# Patient Record
Sex: Female | Born: 1990 | Race: Asian | Hispanic: No | Marital: Married
Health system: Southern US, Community
[De-identification: ages and names within clinical notes are randomized; demographics above are authoritative.]

---

## 2016-06-12 ENCOUNTER — Inpatient Hospital Stay (HOSPITAL_COMMUNITY): Admission: AD | Admit: 2016-06-12 | Payer: Self-pay | Source: Ambulatory Visit | Admitting: Obstetrics and Gynecology

## 2019-11-29 ENCOUNTER — Other Ambulatory Visit: Payer: Self-pay

## 2019-11-29 ENCOUNTER — Ambulatory Visit: Payer: HRSA Program | Attending: Internal Medicine

## 2019-11-29 DIAGNOSIS — Z20822 Contact with and (suspected) exposure to covid-19: Secondary | ICD-10-CM

## 2019-12-01 LAB — NOVEL CORONAVIRUS, NAA: SARS-CoV-2, NAA: NOT DETECTED

## 2019-12-14 ENCOUNTER — Ambulatory Visit: Payer: Self-pay | Attending: Internal Medicine

## 2019-12-14 DIAGNOSIS — Z20822 Contact with and (suspected) exposure to covid-19: Secondary | ICD-10-CM | POA: Insufficient documentation

## 2019-12-15 LAB — NOVEL CORONAVIRUS, NAA: SARS-CoV-2, NAA: NOT DETECTED

## 2019-12-28 ENCOUNTER — Ambulatory Visit: Payer: Self-pay | Attending: Internal Medicine

## 2019-12-28 DIAGNOSIS — Z20822 Contact with and (suspected) exposure to covid-19: Secondary | ICD-10-CM

## 2019-12-30 LAB — NOVEL CORONAVIRUS, NAA: SARS-CoV-2, NAA: NOT DETECTED

## 2020-01-14 ENCOUNTER — Ambulatory Visit: Payer: Self-pay | Attending: Internal Medicine

## 2020-01-14 DIAGNOSIS — Z20822 Contact with and (suspected) exposure to covid-19: Secondary | ICD-10-CM

## 2020-01-15 LAB — NOVEL CORONAVIRUS, NAA: SARS-CoV-2, NAA: NOT DETECTED

## 2020-01-26 ENCOUNTER — Emergency Department (HOSPITAL_COMMUNITY)
Admission: EM | Admit: 2020-01-26 | Discharge: 2020-01-27 | Disposition: A | Discharge status: Home / Self Care | Payer: Self-pay | Attending: Emergency Medicine | Admitting: Emergency Medicine

## 2020-01-26 ENCOUNTER — Encounter (HOSPITAL_COMMUNITY): Payer: Self-pay

## 2020-01-26 ENCOUNTER — Other Ambulatory Visit: Payer: Self-pay

## 2020-01-26 DIAGNOSIS — R309 Painful micturition, unspecified: Secondary | ICD-10-CM | POA: Insufficient documentation

## 2020-01-26 DIAGNOSIS — R350 Frequency of micturition: Secondary | ICD-10-CM | POA: Insufficient documentation

## 2020-01-26 DIAGNOSIS — N12 Tubulo-interstitial nephritis, not specified as acute or chronic: Secondary | ICD-10-CM | POA: Insufficient documentation

## 2020-01-26 DIAGNOSIS — R110 Nausea: Secondary | ICD-10-CM | POA: Insufficient documentation

## 2020-01-26 LAB — URINALYSIS, ROUTINE W REFLEX MICROSCOPIC
Bilirubin Urine: NEGATIVE
Glucose, UA: NEGATIVE mg/dL
Ketones, ur: 5 mg/dL — AB
Nitrite: NEGATIVE
Protein, ur: 30 mg/dL — AB
Specific Gravity, Urine: 1.012 (ref 1.005–1.030)
WBC, UA: >50 WBC/hpf — ABNORMAL HIGH (ref 0–5)
pH: 8 (ref 5.0–8.0)

## 2020-01-26 LAB — CBC
HCT: 41.9 % (ref 36.0–46.0)
Hemoglobin: 14.3 g/dL (ref 12.0–15.0)
MCH: 28.4 pg (ref 26.0–34.0)
MCHC: 34.1 g/dL (ref 30.0–36.0)
MCV: 83.3 fL (ref 80.0–100.0)
Platelets: 416 10*3/uL — ABNORMAL HIGH (ref 150–400)
RBC: 5.03 MIL/uL (ref 3.87–5.11)
RDW: 12 % (ref 11.5–15.5)
WBC: 10.6 10*3/uL — ABNORMAL HIGH (ref 4.0–10.5)
nRBC: 0 % (ref 0.0–0.2)

## 2020-01-26 LAB — COMPREHENSIVE METABOLIC PANEL
ALT: 57 U/L — ABNORMAL HIGH (ref 0–44)
AST: 49 U/L — ABNORMAL HIGH (ref 15–41)
Albumin: 4.3 g/dL (ref 3.5–5.0)
Alkaline Phosphatase: 79 U/L (ref 38–126)
Anion gap: 13 (ref 5–15)
BUN: 7 mg/dL (ref 6–20)
CO2: 23 mmol/L (ref 22–32)
Calcium: 9.5 mg/dL (ref 8.9–10.3)
Chloride: 100 mmol/L (ref 98–111)
Creatinine, Ser: 0.7 mg/dL (ref 0.44–1.00)
GFR calc Af Amer: >60 mL/min (ref >60)
GFR calc non Af Amer: >60 mL/min (ref >60)
Glucose, Bld: 108 mg/dL — ABNORMAL HIGH (ref 70–99)
Potassium: 3.7 mmol/L (ref 3.5–5.1)
Sodium: 136 mmol/L (ref 135–145)
Total Bilirubin: 0.9 mg/dL (ref 0.3–1.2)
Total Protein: 7.8 g/dL (ref 6.5–8.1)

## 2020-01-26 LAB — I-STAT BETA HCG BLOOD, ED (MC, WL, AP ONLY): I-stat hCG, quantitative: <5 m[IU]/mL (ref <5)

## 2020-01-26 LAB — LIPASE, BLOOD: Lipase: 25 U/L (ref 11–51)

## 2020-01-26 MED ORDER — SODIUM CHLORIDE 0.9% FLUSH
3.0000 mL | Freq: Once | INTRAVENOUS | Status: AC
  Administered 2020-01-27: 02:00:00 3 mL via INTRAVENOUS

## 2020-01-26 NOTE — ED Triage Notes (Signed)
Pt has been having L sided flank pain for the past 4 days as well as lower abd pain with dysuria and nausea.

## 2020-01-27 ENCOUNTER — Other Ambulatory Visit: Payer: Self-pay

## 2020-01-27 ENCOUNTER — Telehealth (HOSPITAL_COMMUNITY): Payer: Self-pay | Admitting: Emergency Medicine

## 2020-01-27 ENCOUNTER — Emergency Department (HOSPITAL_COMMUNITY): Payer: Self-pay

## 2020-01-27 MED ORDER — SODIUM CHLORIDE 0.9 % IV SOLN
2.0000 g | Freq: Once | INTRAVENOUS | Status: AC
  Administered 2020-01-27: 03:00:00 2 g via INTRAVENOUS
  Filled 2020-01-27: qty 20

## 2020-01-27 MED ORDER — ONDANSETRON HCL 4 MG PO TABS: 4.0000 mg | ORAL_TABLET | Freq: Four times a day (QID) | ORAL | 0 refills | Status: AC | PRN

## 2020-01-27 MED ORDER — HYDROCODONE-ACETAMINOPHEN 5-325 MG PO TABS: 1.0000 | ORAL_TABLET | ORAL | 0 refills | Status: DC | PRN

## 2020-01-27 MED ORDER — ONDANSETRON HCL 4 MG/2ML IJ SOLN
4.0000 mg | Freq: Once | INTRAMUSCULAR | Status: AC
  Administered 2020-01-27: 03:00:00 4 mg via INTRAVENOUS
  Filled 2020-01-27: qty 2

## 2020-01-27 MED ORDER — MORPHINE SULFATE (PF) 4 MG/ML IV SOLN
4.0000 mg | Freq: Once | INTRAVENOUS | Status: AC
  Administered 2020-01-27: 03:00:00 4 mg via INTRAVENOUS
  Filled 2020-01-27: qty 1

## 2020-01-27 MED ORDER — SODIUM CHLORIDE 0.9 % IV BOLUS
500.0000 mL | Freq: Once | INTRAVENOUS | Status: AC
  Administered 2020-01-27: 500 mL via INTRAVENOUS

## 2020-01-27 MED ORDER — CEPHALEXIN 500 MG PO CAPS: 500.0000 mg | ORAL_CAPSULE | Freq: Two times a day (BID) | ORAL | 0 refills | Status: AC

## 2020-01-27 MED ORDER — HYDROCODONE-ACETAMINOPHEN 5-325 MG PO TABS: 1.0000 | ORAL_TABLET | Freq: Four times a day (QID) | ORAL | 0 refills | Status: AC | PRN

## 2020-01-27 NOTE — Telephone Encounter (Signed)
I was notified that norco wasn't sent. Sent it to [facility]

## 2020-01-27 NOTE — Discharge Instructions (Signed)
If you develop high fever, increasing pain, vomiting that prevents you from taking your antibiotics, then return to the ER.

## 2020-01-27 NOTE — ED Provider Notes (Signed)
[facility] EMERGENCY DEPARTMENT Provider Note   CSN: [id number] Arrival date & time: 01/26/20  1959     History Chief Complaint  Patient presents with  . Abdominal Pain  . Flank Pain    Michelle Gillespie is a 29 y.o. female.  Patient presents to the emergency department for evaluation of flank and abdominal pain.  Symptoms present for 4 days.  She has noticed concomitant increased urinary frequency with some painful urination.  She has felt nausea but no vomiting.  She has been feeling like she has had a fever.  Patient reports that years ago in the [place] she was admitted for similar symptoms, unsure if it was an infection or a kidney stone.        History reviewed. No pertinent past medical history.  There are no problems to display for this patient.   History reviewed. No pertinent surgical history.   OB History   No obstetric history on file.     No family history on file.  Social History   Tobacco Use  . Smoking status: Not on file  . Smokeless tobacco: Never Used  Substance Use Topics  . Alcohol use: Never  . Drug use: Never    Home Medications Prior to Admission medications   Medication Sig Start Date End Date Taking? Authorizing Provider  cephALEXin (KEFLEX) 500 MG capsule Take 1 capsule (500 mg total) by mouth 2 (two) times daily. 01/27/20   Gilda Crease, MD  HYDROcodone-acetaminophen (NORCO/VICODIN) 5-325 MG tablet Take 1 tablet by mouth every 4 (four) hours as needed for moderate pain. 01/27/20   Gilda Crease, MD  ondansetron (ZOFRAN) 4 MG tablet Take 1 tablet (4 mg total) by mouth every 6 (six) hours as needed for nausea or vomiting. 01/27/20   Gilda Crease, MD    Allergies    Shrimp [shellfish allergy]  Review of Systems   Review of Systems  Genitourinary: Positive for dysuria, flank pain and frequency.  All other systems reviewed and are negative.   Physical Exam Updated Vital Signs BP  (!) 109/92   Pulse 81   Temp 99.4 F (37.4 C) (Oral)   Resp 16   LMP 01/08/2020   SpO2 97%   Physical Exam Vitals and nursing note reviewed.  Constitutional:      General: She is not in acute distress.    Appearance: Normal appearance. She is well-developed.  HENT:     Head: Normocephalic and atraumatic.     Right Ear: Hearing normal.     Left Ear: Hearing normal.     Nose: Nose normal.  Eyes:     Conjunctiva/sclera: Conjunctivae normal.     Pupils: Pupils are equal, round, and reactive to light.  Cardiovascular:     Rate and Rhythm: Regular rhythm.     Heart sounds: S1 normal and S2 normal. No murmur. No friction rub. No gallop.   Pulmonary:     Effort: Pulmonary effort is normal. No respiratory distress.     Breath sounds: Normal breath sounds.  Chest:     Chest wall: No tenderness.  Abdominal:     General: Bowel sounds are normal.     Palpations: Abdomen is soft.     Tenderness: There is no abdominal tenderness. There is left CVA tenderness. There is no guarding or rebound. Negative signs include Murphy's sign and McBurney's sign.     Hernia: No hernia is present.  Musculoskeletal:  General: Normal range of motion.     Cervical back: Normal range of motion and neck supple.  Skin:    General: Skin is warm and dry.     Findings: No rash.  Neurological:     Mental Status: She is alert and oriented to person, place, and time.     GCS: GCS eye subscore is 4. GCS verbal subscore is 5. GCS motor subscore is 6.     Cranial Nerves: No cranial nerve deficit.     Sensory: No sensory deficit.     Coordination: Coordination normal.  Psychiatric:        Speech: Speech normal.        Behavior: Behavior normal.        Thought Content: Thought content normal.     ED Results / Procedures / Treatments   Labs (all labs ordered are listed, but only abnormal results are displayed) Labs Reviewed  COMPREHENSIVE METABOLIC PANEL - Abnormal; Notable for the following  components:      Result Value   Glucose, Bld 108 (*)    AST 49 (*)    ALT 57 (*)    All other components within normal limits  CBC - Abnormal; Notable for the following components:   WBC 10.6 (*)    Platelets 416 (*)    All other components within normal limits  URINALYSIS, ROUTINE W REFLEX MICROSCOPIC - Abnormal; Notable for the following components:   APPearance CLOUDY (*)    Hgb urine dipstick SMALL (*)    Ketones, ur 5 (*)    Protein, ur 30 (*)    Leukocytes,Ua LARGE (*)    WBC, UA >50 (*)    Bacteria, UA MANY (*)    Non Squamous Epithelial 0-5 (*)    All other components within normal limits  URINE CULTURE  LIPASE, BLOOD  I-STAT BETA HCG BLOOD, ED ([facility], [facility], [facility] ONLY)    EKG None  Radiology CT RENAL STONE STUDY  Result Date: 01/27/2020 CLINICAL DATA:  Left flank pain. Lower abdominal pain. Dysuria. Nausea. EXAM: CT ABDOMEN AND PELVIS WITHOUT CONTRAST TECHNIQUE: Multidetector CT imaging of the abdomen and pelvis was performed following the standard protocol without IV contrast. COMPARISON:  None. FINDINGS: Lower chest: Minimal hypoventilatory changes in the dependent most lung bases. No pleural fluid. Hepatobiliary: No focal liver abnormality is seen. No gallstones, gallbladder wall thickening, or biliary dilatation. Pancreas: Unremarkable. No pancreatic ductal dilatation or surrounding inflammatory changes. Spleen: Normal in size without focal abnormality. Adrenals/Urinary Tract: Normal adrenal glands. Slight prominence of the left renal collecting system without frank hydronephrosis. Minimal fat stranding at the left renal pelvis. No renal or ureteral calculi. Right kidney appears normal. Urinary bladder is physiologically distended. No bladder wall thickening. Stomach/Bowel: Stomach is within normal limits. Appendix appears normal. No evidence of bowel wall thickening, distention, or inflammatory changes. Moderate stool burden, primarily in the right colon. Vascular/Lymphatic:  Unenhanced aorta is unremarkable. No adenopathy. Reproductive: Retroverted uterus. Left ovary is normal. Right ovary not well visualized. There is no suspicious adnexal mass. Other: No free air, free fluid, or intra-abdominal fluid collection. Musculoskeletal: There are no acute or suspicious osseous abnormalities. IMPRESSION: 1. Slight prominence of the left renal collecting system without frank hydronephrosis. Minimal fat stranding at the left renal pelvis. Findings suggest pyelonephritis, less likely recently passed stone. No renal or ureteral calculi. 2. Moderate stool burden in the right colon, can be seen with constipation. Electronically Signed   By: Aurther Loft.D.  On: 01/27/2020 03:05    Procedures Procedures (including critical care time)  Medications Ordered in ED Medications  cefTRIAXone (ROCEPHIN) 2 g in sodium chloride 0.9 % 100 mL IVPB (2 g Intravenous New Bag/Given 01/27/20 0259)  sodium chloride flush (NS) 0.9 % injection 3 mL (3 mLs Intravenous Given 01/27/20 0229)  sodium chloride 0.9 % bolus 500 mL (500 mLs Intravenous Bolus from Bag 01/27/20 0259)  ondansetron (ZOFRAN) injection 4 mg (4 mg Intravenous Given 01/27/20 0230)  morphine 4 MG/ML injection 4 mg (4 mg Intravenous Given 01/27/20 0230)    ED Course  I have reviewed the triage vital signs and the nursing notes.  Pertinent labs & imaging results that were available during my care of the patient were reviewed by me and considered in my medical decision making (see chart for details).    MDM Rules/Calculators/A&P                      Patient presents to the emergency department for evaluation of left flank pain.  She has had some concomitant urinary symptoms.  Urinalysis shows obvious urinary tract infection.  CT scan does not show any ureterolithiasis, does show symptoms of uncomplicated pyelonephritis.  Patient is not having any nausea or vomiting, will be a candidate for outpatient management.  She was started on  Rocephin here in the ER.  Discharged with analgesia, Zofran, Keflex.  Patient given return precautions.  Final Clinical Impression(s) / ED Diagnoses Final diagnoses:  Pyelonephritis    Rx / DC Orders ED Discharge Orders         Ordered    HYDROcodone-acetaminophen (NORCO/VICODIN) 5-325 MG tablet  Every 4 hours PRN     01/27/20 0322    ondansetron (ZOFRAN) 4 MG tablet  Every 6 hours PRN     01/27/20 0322    cephALEXin (KEFLEX) 500 MG capsule  2 times daily     01/27/20 0322           Gilda Crease, MD 01/27/20 [phone]

## 2020-01-29 LAB — URINE CULTURE: Culture: >=100000 — AB

## 2020-01-30 ENCOUNTER — Telehealth: Payer: Self-pay | Admitting: *Deleted

## 2020-01-30 NOTE — Telephone Encounter (Signed)
Post ED Visit - Positive Culture Follow-up  Culture report reviewed by antimicrobial stewardship pharmacist: [facility] []  , Pharm.D. [x]  Enzo Bi, Pharm.D., BCPS AQ-ID []  , Pharm.D., BCPS []  Celedonio Miyamoto, Pharm.D., BCPS []  [place], Garvin Fila.D., BCPS, AAHIVP []  , Pharm.D., BCPS, AAHIVP []  Georgina Pillion, PharmD, BCPS []  , PharmD, BCPS []  [place], PharmD, BCPS []  [address], PharmD []  , PharmD, BCPS []  Estella Husk, PharmD  Pharmacy Team []  Lysle Pearl, PharmD []  , PharmD []  Phillips Climes, PharmD []  , Rph []  Agapito Games) , PharmD []  Verlan Friends, PharmD []  , PharmD []  Mervyn Gay, PharmD []  , PharmD []  Vinnie Level, PharmD []  [facility], PharmD []  , PharmD []  Len Childs, PharmD   Positive urine culture Treated with Cephalexin, organism sensitive to the same and no further patient follow-up is required at this time.  [facility] 01/30/2020, 8:36 AM

## 2020-03-17 ENCOUNTER — Other Ambulatory Visit: Payer: Self-pay

## 2020-03-17 ENCOUNTER — Ambulatory Visit: Payer: Self-pay | Attending: Internal Medicine

## 2020-03-17 DIAGNOSIS — Z20822 Contact with and (suspected) exposure to covid-19: Secondary | ICD-10-CM | POA: Insufficient documentation

## 2020-03-18 LAB — SARS-COV-2, NAA 2 DAY TAT

## 2020-03-18 LAB — NOVEL CORONAVIRUS, NAA: SARS-CoV-2, NAA: NOT DETECTED

## 2021-01-15 IMAGING — CT CT RENAL STONE PROTOCOL
2 of 4 series · 15 of 46 positions shown, 17 images · non-contrast
Comparison: None.

CLINICAL DATA: Left flank pain. Lower abdominal pain. Dysuria.
Nausea.

EXAM:
CT ABDOMEN AND PELVIS WITHOUT CONTRAST
TECHNIQUE: Multidetector CT imaging of the abdomen and pelvis was performed
following the standard protocol without IV contrast.

[Series 3: ap without · axial · non-contrast · 0.69mm/px · z∈[-718,-288]mm · 12 of 96 slices shown, 14 images]
[im 5/96  soft-tissue]
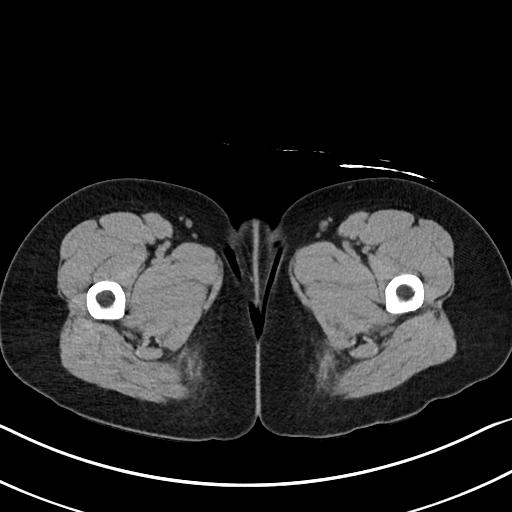
[im 5/96  bone]
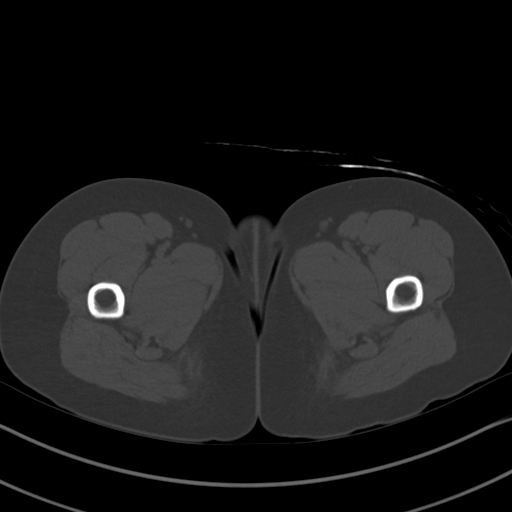
[im 15/96  soft-tissue]
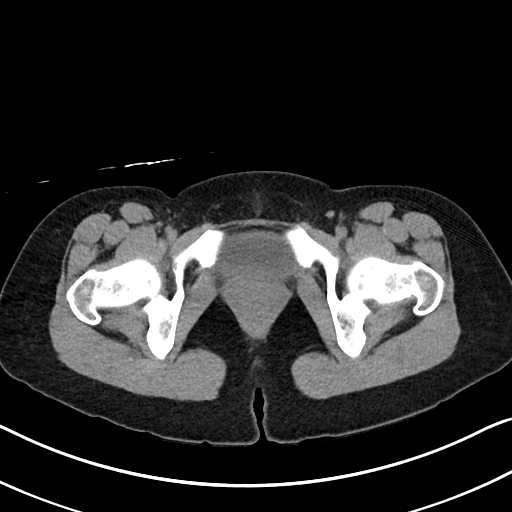
[im 20/96  soft-tissue]
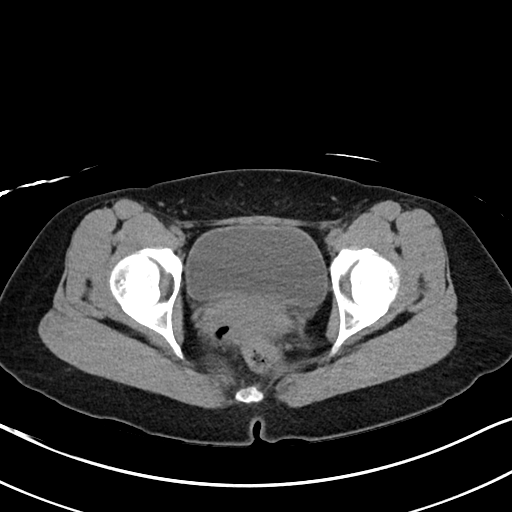
[im 29/96  soft-tissue]
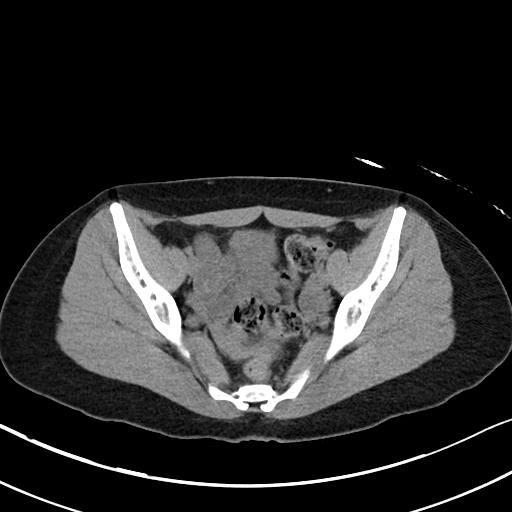
[im 39/96  soft-tissue]
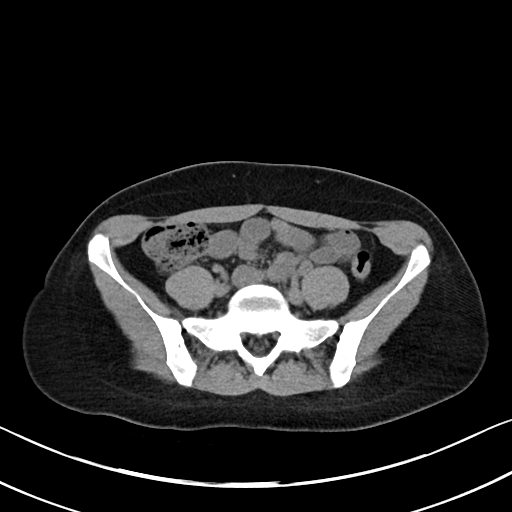
[im 43/96  soft-tissue]
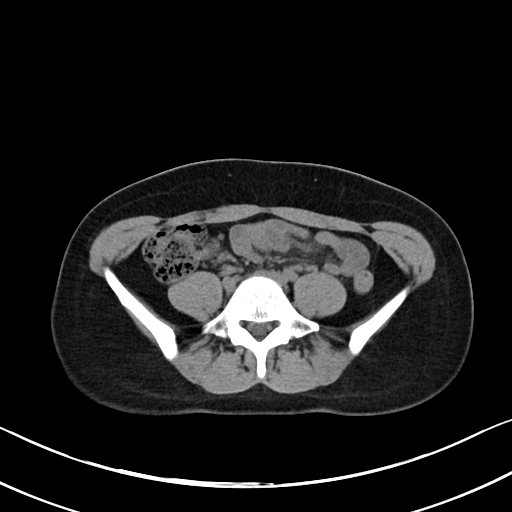
[im 53/96  soft-tissue]
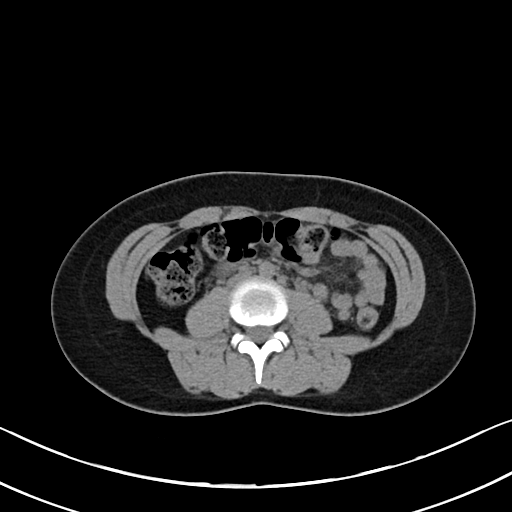
[im 58/96  soft-tissue]
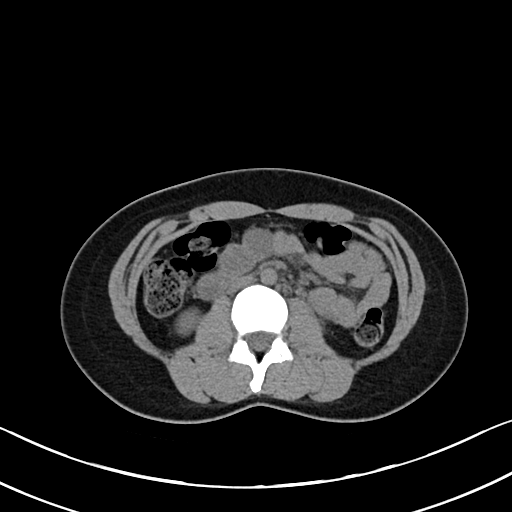
[im 67/96  soft-tissue]
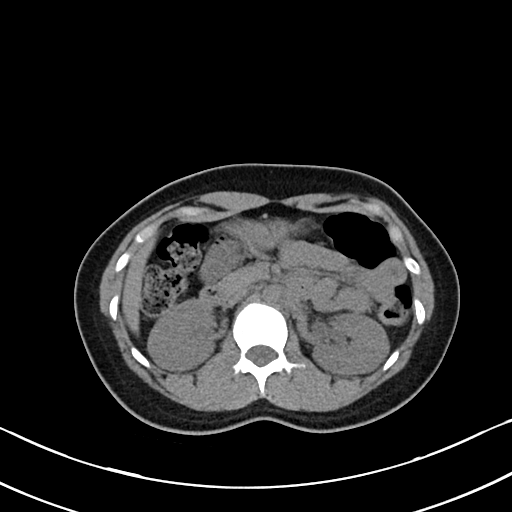
[im 67/96  bone]
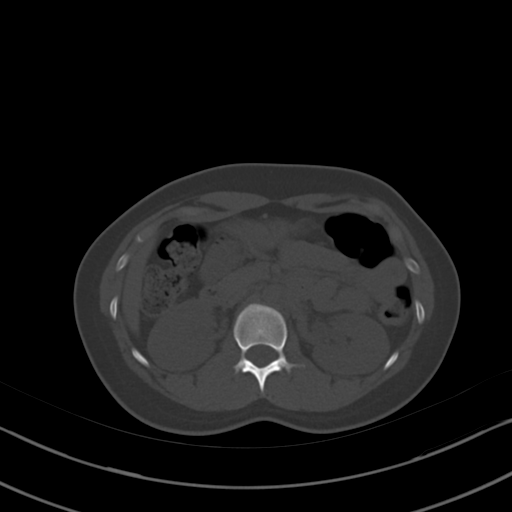
[im 77/96  soft-tissue]
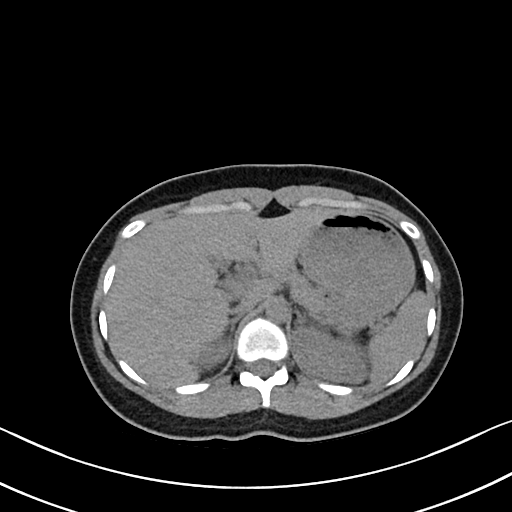
[im 81/96  soft-tissue]
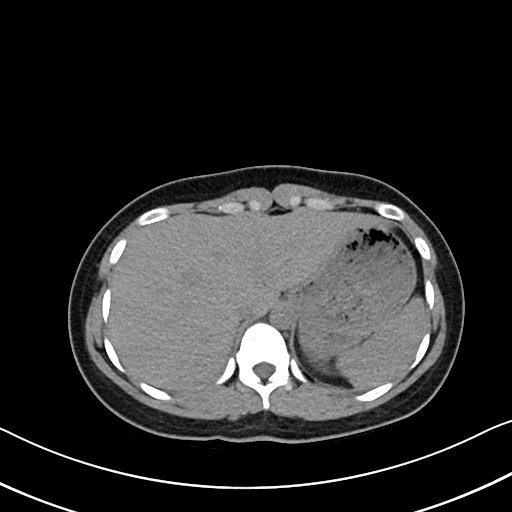
[im 91/96  soft-tissue]
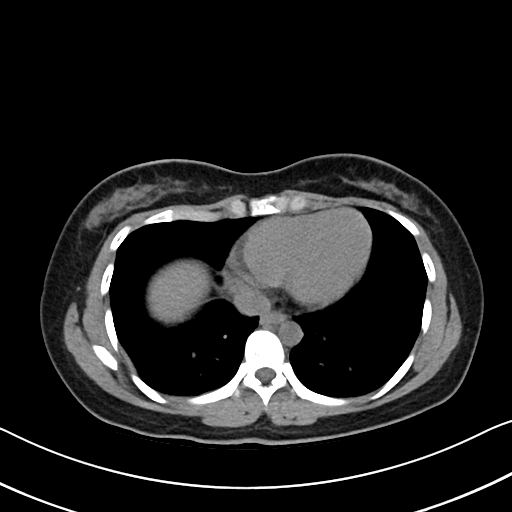

[Series 6: cor · coronal · 0.60mm/px · 3 of 73 slices shown]
[im 25/73  soft-tissue]
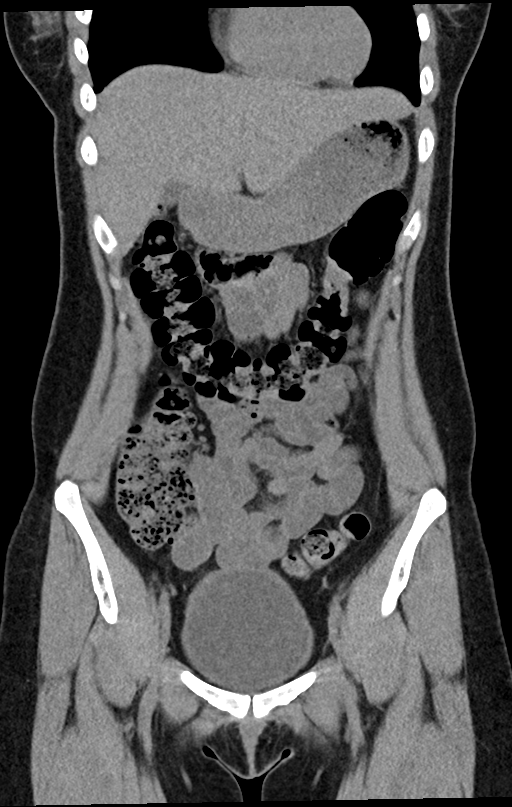
[im 33/73  soft-tissue]
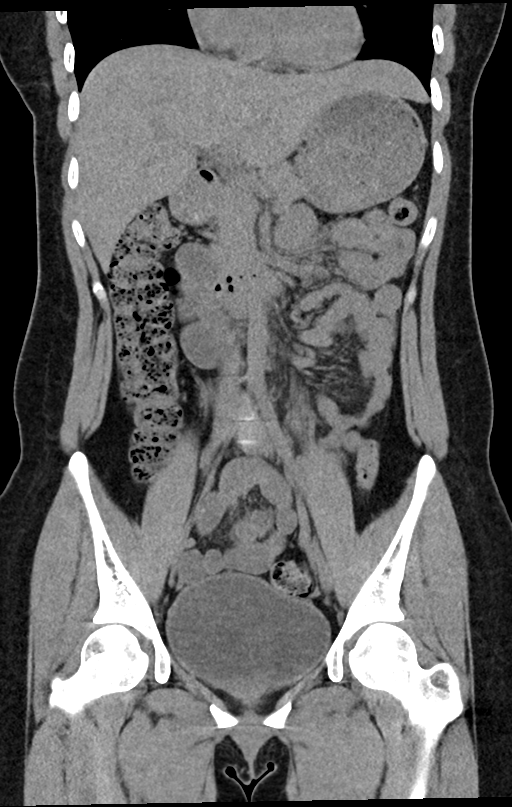
[im 41/73  soft-tissue]
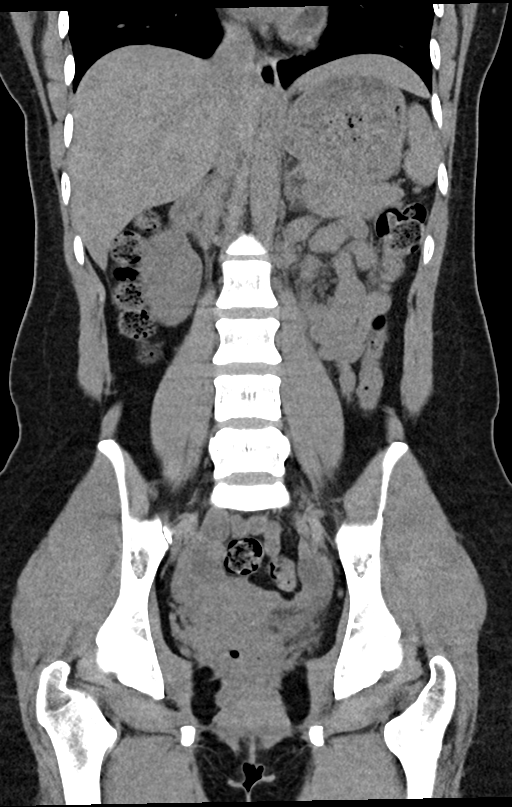

[15 of 46 positions shown; findings below may reference images not displayed]

FINDINGS: Lower chest: Minimal hypoventilatory changes in the dependent most
lung bases. No pleural fluid.

Hepatobiliary: No focal liver abnormality is seen. No gallstones,
gallbladder wall thickening, or biliary dilatation.

Pancreas: Unremarkable. No pancreatic ductal dilatation or
surrounding inflammatory changes.

Spleen: Normal in size without focal abnormality.

Adrenals/Urinary Tract: Normal adrenal glands. Slight prominence of
the left renal collecting system without frank hydronephrosis.
Minimal fat stranding at the left renal pelvis. No renal or ureteral
calculi. Right kidney appears normal. Urinary bladder is
physiologically distended. No bladder wall thickening.

Stomach/Bowel: Stomach is within normal limits. Appendix appears
normal. No evidence of bowel wall thickening, distention, or
inflammatory changes. Moderate stool burden, primarily in the right
colon.

Vascular/Lymphatic: Unenhanced aorta is unremarkable. No adenopathy.

Reproductive: Retroverted uterus. Left ovary is normal. Right ovary
not well visualized. There is no suspicious adnexal mass.

Other: No free air, free fluid, or intra-abdominal fluid collection.

Musculoskeletal: There are no acute or suspicious osseous
abnormalities.
IMPRESSION: 1. Slight prominence of the left renal collecting system without
frank hydronephrosis. Minimal fat stranding at the left renal
pelvis. Findings suggest pyelonephritis, less likely recently passed
stone. No renal or ureteral calculi.
2. Moderate stool burden in the right colon, can be seen with
constipation.

## 2021-02-02 ENCOUNTER — Other Ambulatory Visit: Payer: Self-pay

## 2021-02-02 ENCOUNTER — Ambulatory Visit: Payer: Self-pay | Attending: Internal Medicine

## 2021-02-02 DIAGNOSIS — Z20822 Contact with and (suspected) exposure to covid-19: Secondary | ICD-10-CM | POA: Insufficient documentation

## 2021-02-03 LAB — SARS-COV-2, NAA 2 DAY TAT

## 2021-02-03 LAB — SPECIMEN STATUS REPORT

## 2021-02-03 LAB — NOVEL CORONAVIRUS, NAA: SARS-CoV-2, NAA: NOT DETECTED
